# Patient Record
Sex: Female | Born: 1990 | Race: Black or African American | Hispanic: No | Marital: Single
Health system: Southern US, Community
[De-identification: ages and names within clinical notes are randomized; demographics above are authoritative.]

---

## 2013-10-08 ENCOUNTER — Ambulatory Visit: Payer: Self-pay

## 2014-12-23 ENCOUNTER — Emergency Department (HOSPITAL_COMMUNITY): Payer: Medicaid Other

## 2014-12-23 ENCOUNTER — Emergency Department (HOSPITAL_COMMUNITY)
Admission: EM | Admit: 2014-12-23 | Discharge: 2014-12-23 | Disposition: A | Discharge status: Home / Self Care | Payer: Medicaid Other | Attending: Emergency Medicine | Admitting: Emergency Medicine

## 2014-12-23 ENCOUNTER — Encounter (HOSPITAL_COMMUNITY): Payer: Self-pay

## 2014-12-23 DIAGNOSIS — B349 Viral infection, unspecified: Secondary | ICD-10-CM | POA: Diagnosis not present

## 2014-12-23 DIAGNOSIS — Z3202 Encounter for pregnancy test, result negative: Secondary | ICD-10-CM | POA: Diagnosis not present

## 2014-12-23 DIAGNOSIS — R1110 Vomiting, unspecified: Secondary | ICD-10-CM | POA: Diagnosis present

## 2014-12-23 LAB — CBC WITH DIFFERENTIAL/PLATELET
BASOS ABS: 0 10*3/uL (ref 0.0–0.1)
Basophils Relative: 1 % (ref 0–1)
EOS ABS: 0.1 10*3/uL (ref 0.0–0.7)
EOS PCT: 3 % (ref 0–5)
HCT: 39.7 % (ref 36.0–46.0)
Hemoglobin: 14 g/dL (ref 12.0–15.0)
Lymphocytes Relative: 17 % (ref 12–46)
Lymphs Abs: 0.8 10*3/uL (ref 0.7–4.0)
MCH: 30.1 pg (ref 26.0–34.0)
MCHC: 35.3 g/dL (ref 30.0–36.0)
MCV: 85.4 fL (ref 78.0–100.0)
Monocytes Absolute: 0.5 10*3/uL (ref 0.1–1.0)
Monocytes Relative: 9 % (ref 3–12)
NEUTROS ABS: 3.4 10*3/uL (ref 1.7–7.7)
Neutrophils Relative %: 70 % (ref 43–77)
PLATELETS: 200 10*3/uL (ref 150–400)
RBC: 4.65 MIL/uL (ref 3.87–5.11)
RDW: 12.1 % (ref 11.5–15.5)
WBC: 4.8 10*3/uL (ref 4.0–10.5)

## 2014-12-23 LAB — COMPREHENSIVE METABOLIC PANEL
ALT: 10 U/L (ref 0–35)
ANION GAP: 9 (ref 5–15)
AST: 19 U/L (ref 0–37)
Albumin: 3.8 g/dL (ref 3.5–5.2)
Alkaline Phosphatase: 63 U/L (ref 39–117)
BUN: 10 mg/dL (ref 6–23)
CO2: 27 mmol/L (ref 19–32)
CREATININE: 0.79 mg/dL (ref 0.50–1.10)
Calcium: 9.4 mg/dL (ref 8.4–10.5)
Chloride: 101 mmol/L (ref 96–112)
GFR calc Af Amer: >90 mL/min (ref >90)
GFR calc non Af Amer: >90 mL/min (ref >90)
GLUCOSE: 81 mg/dL (ref 70–99)
Potassium: 3.8 mmol/L (ref 3.5–5.1)
Sodium: 137 mmol/L (ref 135–145)
Total Bilirubin: 0.6 mg/dL (ref 0.3–1.2)
Total Protein: 7 g/dL (ref 6.0–8.3)

## 2014-12-23 LAB — URINALYSIS, ROUTINE W REFLEX MICROSCOPIC
BILIRUBIN URINE: NEGATIVE
Glucose, UA: NEGATIVE mg/dL
Hgb urine dipstick: NEGATIVE
KETONES UR: NEGATIVE mg/dL
LEUKOCYTES UA: NEGATIVE
NITRITE: NEGATIVE
PH: 6 (ref 5.0–8.0)
PROTEIN: NEGATIVE mg/dL
Specific Gravity, Urine: 1.024 (ref 1.005–1.030)
UROBILINOGEN UA: 1 mg/dL (ref 0.0–1.0)

## 2014-12-23 LAB — POC URINE PREG, ED: Preg Test, Ur: NEGATIVE

## 2014-12-23 LAB — LIPASE, BLOOD: LIPASE: 35 U/L (ref 11–59)

## 2014-12-23 MED ORDER — DIPHENHYDRAMINE HCL 50 MG/ML IJ SOLN
25.0000 mg | Freq: Once | INTRAMUSCULAR | Status: AC
  Administered 2014-12-23: 25 mg via INTRAVENOUS
  Filled 2014-12-23: qty 1

## 2014-12-23 MED ORDER — KETOROLAC TROMETHAMINE 30 MG/ML IJ SOLN
30.0000 mg | Freq: Once | INTRAMUSCULAR | Status: AC
  Administered 2014-12-23: 30 mg via INTRAVENOUS
  Filled 2014-12-23: qty 1

## 2014-12-23 MED ORDER — ONDANSETRON 4 MG PO TBDP: 4.0000 mg | ORAL_TABLET | Freq: Three times a day (TID) | ORAL | Status: AC | PRN

## 2014-12-23 MED ORDER — SODIUM CHLORIDE 0.9 % IV BOLUS (SEPSIS)
1000.0000 mL | Freq: Once | INTRAVENOUS | Status: AC
  Administered 2014-12-23: 1000 mL via INTRAVENOUS

## 2014-12-23 MED ORDER — METOCLOPRAMIDE HCL 5 MG/ML IJ SOLN
10.0000 mg | Freq: Once | INTRAMUSCULAR | Status: AC
  Administered 2014-12-23: 10 mg via INTRAVENOUS
  Filled 2014-12-23: qty 2

## 2014-12-23 MED ORDER — IBUPROFEN 800 MG PO TABS: 800.0000 mg | ORAL_TABLET | Freq: Three times a day (TID) | ORAL | Status: AC

## 2014-12-23 NOTE — ED Provider Notes (Signed)
CSN: [id number]     Arrival date & time 12/23/14  1616 History   None    Chief Complaint  Patient presents with  . Headache  . Emesis  . Abdominal Pain     (Consider location/radiation/quality/duration/timing/severity/associated sxs/prior Treatment) Patient is a 24 y.o. female presenting with cough. The history is provided by the patient. No language interpreter was used.  Cough Cough characteristics:  Non-productive Severity:  Moderate Onset quality:  Gradual Duration:  2 days Timing:  Constant Progression:  Worsening Chronicity:  New Smoker: no   Context: not with activity   Relieved by:  Nothing Worsened by:  Nothing tried Ineffective treatments:  None tried Associated symptoms: myalgias and rhinorrhea   Associated symptoms: no shortness of breath and no sinus congestion   Risk factors: no recent infection     History reviewed. No pertinent past medical history. History reviewed. No pertinent past surgical history. No family history on file. History  Substance Use Topics  . Smoking status: Not on file  . Smokeless tobacco: Not on file  . Alcohol Use: Not on file   OB History    No data available     Review of Systems  HENT: Positive for rhinorrhea.   Respiratory: Positive for cough. Negative for shortness of breath.   Musculoskeletal: Positive for myalgias.  All other systems reviewed and are negative.     Allergies  Review of patient's allergies indicates no known allergies.  Home Medications   Prior to Admission medications   Not on File   BP 132/91 mmHg  Pulse 110  Temp(Src) 99.4 F (37.4 C)  Resp 16  SpO2 100%  LMP 12/17/2014 Physical Exam  Constitutional: She is oriented to person, place, and time. She appears well-developed and well-nourished.  HENT:  Head: Normocephalic and atraumatic.  Right Ear: External ear normal.  Nose: Nose normal.  Mouth/Throat: Oropharynx is clear and moist.  Eyes: Conjunctivae and EOM are normal. Pupils are  equal, round, and reactive to light.  Neck: Normal range of motion.  Cardiovascular: Normal rate and normal heart sounds.   Pulmonary/Chest: Effort normal and breath sounds normal.  Abdominal: Soft. She exhibits no distension.  Musculoskeletal: Normal range of motion.  Neurological: She is alert and oriented to person, place, and time.  Skin: Skin is warm.  Psychiatric: She has a normal mood and affect.  Nursing note and vitals reviewed.   ED Course  Procedures (including critical care time) Labs Review Labs Reviewed  URINALYSIS, ROUTINE W REFLEX MICROSCOPIC - Abnormal; Notable for the following:    APPearance CLOUDY (*)    All other components within normal limits  CBC WITH DIFFERENTIAL/PLATELET  COMPREHENSIVE METABOLIC PANEL  LIPASE, BLOOD  POC URINE PREG, ED    Imaging Review Dg Chest 2 View  12/23/2014   CLINICAL DATA:  Upper abdominal pain and emesis for 3 days  EXAM: CHEST  2 VIEW  COMPARISON:  None.  FINDINGS: Normal heart size and mediastinal contours. No acute infiltrate or edema. No effusion or pneumothorax. No acute osseous findings.  IMPRESSION: Negative chest.   Electronically Signed   By: Marnee Spring M.D.   On: 12/23/2014 18:17     EKG Interpretation None      MDM   Final diagnoses:  Viral illness    Pt given iv fluids, torodol, reglan and benadryl.    Pt reports feeling better,  Labs normal, chest xray negative,  I suspect viral illness,  Pt advised to follow up with her  Md for recheck in 2-3 days Rx for zofran     Elson AreasLeslie K Sofia, PA-C 12/24/14 1837  Purvis SheffieldForrest Harrison, MD 12/25/14 [phone]

## 2014-12-23 NOTE — ED Notes (Signed)
Per family, patient started feeling bad last night, with HA. Woke up with morning and pain was worse with emesis and a cough. Denies fevers.

## 2014-12-23 NOTE — ED Notes (Signed)
Pt stable, ambulatory, pain decreased to 3/10, friend at bedside

## 2014-12-23 NOTE — Discharge Instructions (Signed)
Cough, Adult  A cough is a reflex that helps clear your throat and airways. It can help heal the body or may be a reaction to an irritated airway. A cough may only last 2 or 3 weeks (acute) or may last more than 8 weeks (chronic).  CAUSES Acute cough:  Viral or bacterial infections. Chronic cough:  Infections.  Allergies.  Asthma.  Post-nasal drip.  Smoking.  Heartburn or acid reflux.  Some medicines.  Chronic lung problems (COPD).  Cancer. SYMPTOMS   Cough.  Fever.  Chest pain.  Increased breathing rate.  High-pitched whistling sound when breathing (wheezing).  Colored mucus that you cough up (sputum). TREATMENT   A bacterial cough may be treated with antibiotic medicine.  A viral cough must run its course and will not respond to antibiotics.  Your caregiver may recommend other treatments if you have a chronic cough. HOME CARE INSTRUCTIONS   Only take over-the-counter or prescription medicines for pain, discomfort, or fever as directed by your caregiver. Use cough suppressants only as directed by your caregiver.  Use a cold steam vaporizer or humidifier in your bedroom or home to help loosen secretions.  Sleep in a semi-upright position if your cough is worse at night.  Rest as needed.  Stop smoking if you smoke. SEEK IMMEDIATE MEDICAL CARE IF:   You have pus in your sputum.  Your cough starts to worsen.  You cannot control your cough with suppressants and are losing sleep.  You begin coughing up blood.  You have difficulty breathing.  You develop pain which is getting worse or is uncontrolled with medicine.  You have a fever. MAKE SURE YOU:   Understand these instructions.  Will watch your condition.  Will get help right away if you are not doing well or get worse. Document Released: 03/05/2011 Document Revised: 11/29/2011 Document Reviewed: 03/05/2011 [facility] Patient Information 2015 [place], [place]. This information is not intended  to replace advice given to you by your health care provider. Make sure you discuss any questions you have with your health care provider. Muscle Pain Muscle pain (myalgia) may be caused by many things, including:  Overuse or muscle strain, especially if you are not in shape. This is the most common cause of muscle pain.  Injury.  Bruises.  Viruses, such as the flu.  Infectious diseases.  Fibromyalgia, which is a chronic condition that causes muscle tenderness, fatigue, and headache.  Autoimmune diseases, including lupus.  Certain drugs, including ACE inhibitors and statins. Muscle pain may be mild or severe. In most cases, the pain lasts only a short time and goes away without treatment. To diagnose the cause of your muscle pain, your health care provider will take your medical history. This means he or she will ask you when your muscle pain began and what has been happening. If you have not had muscle pain for very long, your health care provider may want to wait before doing much testing. If your muscle pain has lasted a long time, your health care provider may want to run tests right away. If your health care provider thinks your muscle pain may be caused by illness, you may need to have additional tests to rule out certain conditions.  Treatment for muscle pain depends on the cause. Home care is often enough to relieve muscle pain. Your health care provider may also prescribe anti-inflammatory medicine. HOME CARE INSTRUCTIONS Watch your condition for any changes. The following actions may help to lessen any discomfort you are  feeling:  Only take over-the-counter or prescription medicines as directed by your health care provider.  Apply ice to the sore muscle:  Put ice in a plastic bag.  Place a towel between your skin and the bag.  Leave the ice on for 15-20 minutes, 3-4 times a day.  You may alternate applying hot and cold packs to the muscle as directed by your health care  provider.  If overuse is causing your muscle pain, slow down your activities until the pain goes away.  Remember that it is normal to feel some muscle pain after starting a workout program. Muscles that have not been used often will be sore at first.  Do regular, gentle exercises if you are not usually active.  Warm up before exercising to lower your risk of muscle pain.  Do not continue working out if the pain is very bad. Bad pain could mean you have injured a muscle. SEEK MEDICAL CARE IF:  Your muscle pain gets worse, and medicines do not help.  You have muscle pain that lasts longer than 3 days.  You have a rash or fever along with muscle pain.  You have muscle pain after a tick bite.  You have muscle pain while working out, even though you are in good physical condition.  You have redness, soreness, or swelling along with muscle pain.  You have muscle pain after starting a new medicine or changing the dose of a medicine. SEEK IMMEDIATE MEDICAL CARE IF:  You have trouble breathing.  You have trouble swallowing.  You have muscle pain along with a stiff neck, fever, and vomiting.  You have severe muscle weakness or cannot move part of your body. MAKE SURE YOU:   Understand these instructions.  Will watch your condition.  Will get help right away if you are not doing well or get worse. Document Released: 07/29/2006 Document Revised: 09/11/2013 Document Reviewed: 07/03/2013 [facility] Patient Information 2015 [place]. This information is not intended to replace advice given to you by your health care provider. Make sure you discuss any questions you have with your health care provider.

## 2016-04-10 IMAGING — DX DG CHEST 2V
2 series · 2 of 2 positions shown · non-contrast
Comparison: None.

CLINICAL DATA: Upper abdominal pain and emesis for 3 days

EXAM:
CHEST  2 VIEW

[chest pa]
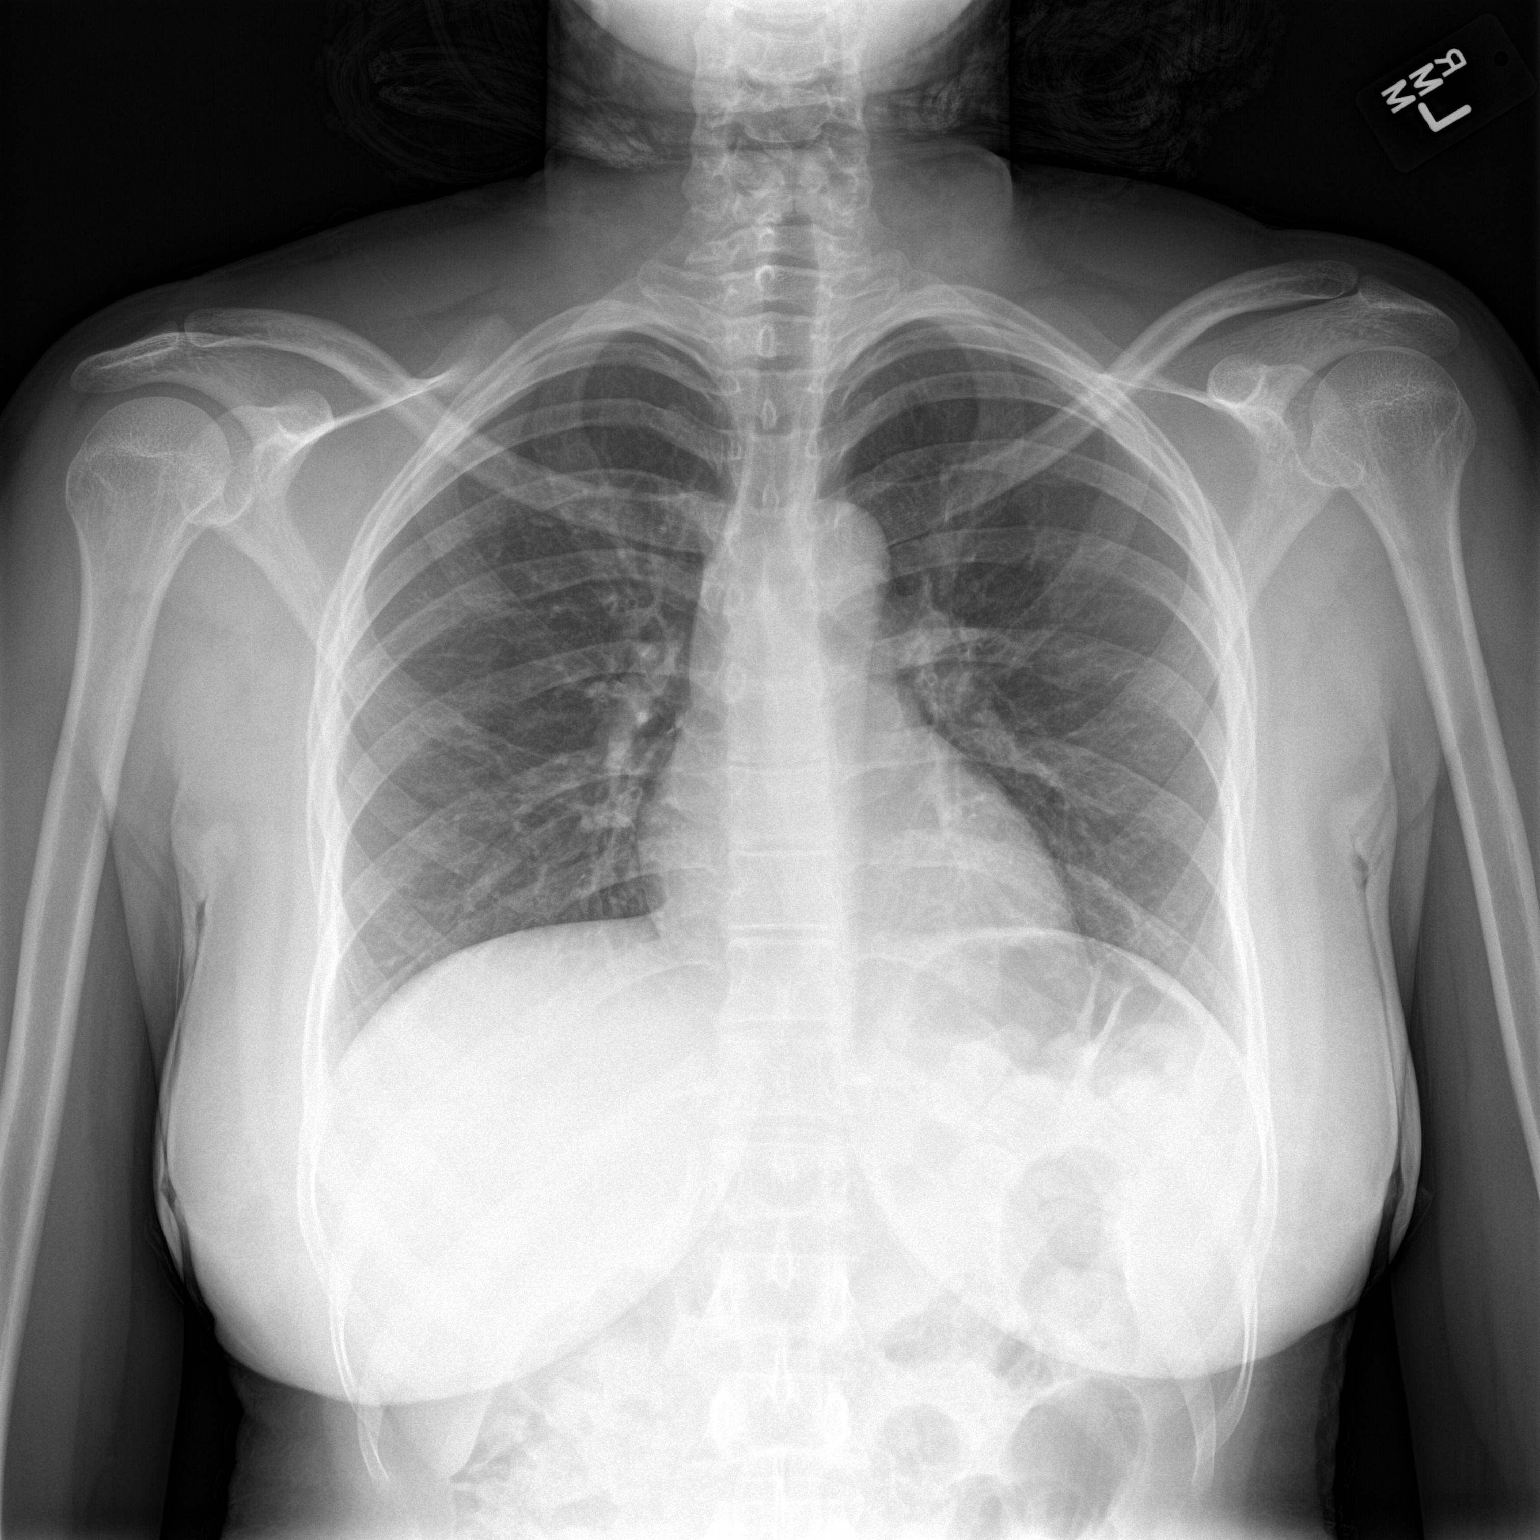

[chest lat]
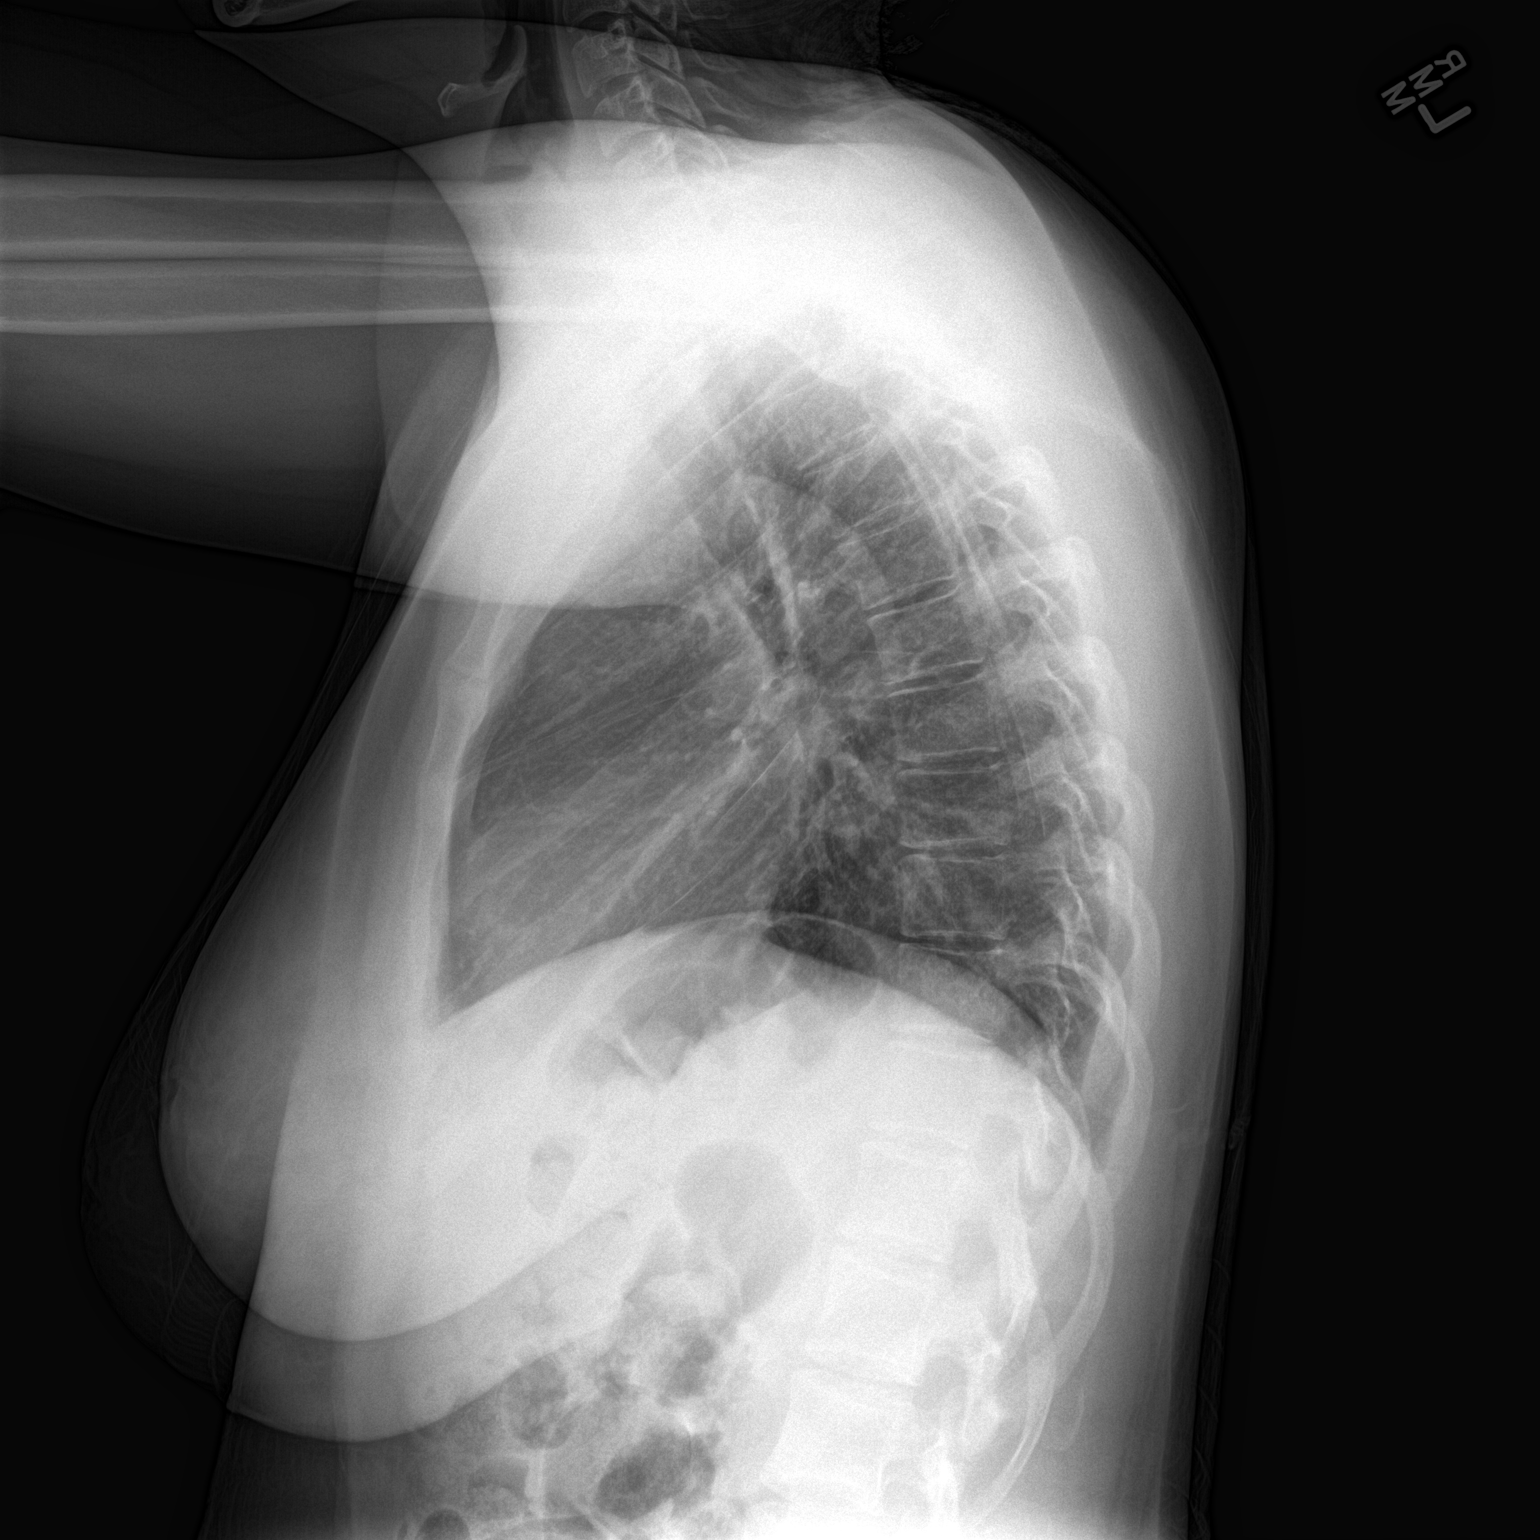

[2 of 2 positions shown; findings below may reference images not displayed]

FINDINGS: Normal heart size and mediastinal contours. No acute infiltrate or
edema. No effusion or pneumothorax. No acute osseous findings.
IMPRESSION: Negative chest.

## 2022-12-23 ENCOUNTER — Other Ambulatory Visit: Payer: Self-pay | Admitting: Family Medicine
